# Patient Record
Sex: Male | Born: 1970 | Race: White | Hispanic: No | Marital: Married | Smoking: Current every day smoker
Health system: Southern US, Community
[De-identification: ages and names within clinical notes are randomized; demographics above are authoritative.]

## PROBLEM LIST (undated history)

## (undated) DIAGNOSIS — M51369 Other intervertebral disc degeneration, lumbar region without mention of lumbar back pain or lower extremity pain: Secondary | ICD-10-CM

## (undated) DIAGNOSIS — M5136 Other intervertebral disc degeneration, lumbar region: Secondary | ICD-10-CM

## (undated) DIAGNOSIS — E7800 Pure hypercholesterolemia, unspecified: Secondary | ICD-10-CM

## (undated) HISTORY — DX: Pure hypercholesterolemia, unspecified: E78.00

## (undated) HISTORY — PX: WRIST SURGERY: SHX841

## (undated) HISTORY — PX: HERNIA REPAIR: SHX51

## (undated) HISTORY — DX: Other intervertebral disc degeneration, lumbar region without mention of lumbar back pain or lower extremity pain: M51.369

## (undated) HISTORY — DX: Other intervertebral disc degeneration, lumbar region: M51.36

## (undated) HISTORY — PX: TONSILLECTOMY: SUR1361

---

## 2004-03-24 ENCOUNTER — Emergency Department (HOSPITAL_COMMUNITY): Admission: EM | Admit: 2004-03-24 | Discharge: 2004-03-24 | Payer: Self-pay | Admitting: Emergency Medicine

## 2004-07-23 ENCOUNTER — Emergency Department: Payer: Self-pay | Admitting: Emergency Medicine

## 2009-02-23 ENCOUNTER — Emergency Department: Payer: Self-pay

## 2012-02-14 ENCOUNTER — Emergency Department: Payer: Self-pay | Admitting: Emergency Medicine

## 2013-03-09 ENCOUNTER — Emergency Department: Payer: Self-pay | Admitting: Emergency Medicine

## 2013-03-09 LAB — DRUG SCREEN, URINE
Amphetamines, Ur Screen: NEGATIVE (ref ?–1000)
Benzodiazepine, Ur Scrn: NEGATIVE (ref ?–200)
MDMA (Ecstasy)Ur Screen: NEGATIVE (ref ?–500)
Opiate, Ur Screen: NEGATIVE (ref ?–300)
Tricyclic, Ur Screen: NEGATIVE (ref ?–1000)

## 2013-03-09 LAB — CBC
HGB: 16.1 g/dL (ref 13.0–18.0)
MCHC: 35.3 g/dL (ref 32.0–36.0)
MCV: 92 fL (ref 80–100)
RBC: 4.98 10*6/uL (ref 4.40–5.90)
RDW: 14.9 % — ABNORMAL HIGH (ref 11.5–14.5)
WBC: 9.4 10*3/uL (ref 3.8–10.6)

## 2013-03-09 LAB — URINALYSIS, COMPLETE
Bacteria: NONE SEEN
Ketone: NEGATIVE
Leukocyte Esterase: NEGATIVE
Nitrite: NEGATIVE
Ph: 7 (ref 4.5–8.0)
RBC,UR: 1 /HPF (ref 0–5)
Squamous Epithelial: NONE SEEN
WBC UR: 1 /HPF (ref 0–5)

## 2013-03-09 LAB — BASIC METABOLIC PANEL
BUN: 12 mg/dL (ref 7–18)
Chloride: 107 mmol/L (ref 98–107)
Creatinine: 1.25 mg/dL (ref 0.60–1.30)
EGFR (African American): >60
EGFR (Non-African Amer.): >60
Potassium: 4.3 mmol/L (ref 3.5–5.1)

## 2013-03-17 DIAGNOSIS — G43909 Migraine, unspecified, not intractable, without status migrainosus: Secondary | ICD-10-CM | POA: Insufficient documentation

## 2014-05-23 ENCOUNTER — Emergency Department: Payer: Self-pay | Admitting: Emergency Medicine

## 2014-06-18 ENCOUNTER — Ambulatory Visit: Payer: Self-pay | Admitting: Surgery

## 2014-12-07 ENCOUNTER — Encounter: Payer: Self-pay | Admitting: Podiatry

## 2014-12-07 ENCOUNTER — Ambulatory Visit (INDEPENDENT_AMBULATORY_CARE_PROVIDER_SITE_OTHER): Payer: BLUE CROSS/BLUE SHIELD

## 2014-12-07 ENCOUNTER — Ambulatory Visit (INDEPENDENT_AMBULATORY_CARE_PROVIDER_SITE_OTHER): Payer: BLUE CROSS/BLUE SHIELD | Admitting: Podiatry

## 2014-12-07 VITALS — BP 131/86 | HR 83 | Resp 16 | Ht 71.0 in | Wt 220.0 lb

## 2014-12-07 DIAGNOSIS — M79673 Pain in unspecified foot: Secondary | ICD-10-CM

## 2014-12-07 DIAGNOSIS — M722 Plantar fascial fibromatosis: Secondary | ICD-10-CM | POA: Diagnosis not present

## 2014-12-07 MED ORDER — METHYLPREDNISOLONE (PAK) 4 MG PO TABS: ORAL_TABLET | ORAL | Status: AC

## 2014-12-07 MED ORDER — MELOXICAM 15 MG PO TABS: 15.0000 mg | ORAL_TABLET | Freq: Every day | ORAL | Status: AC

## 2014-12-07 NOTE — Patient Instructions (Signed)

## 2014-12-07 NOTE — Progress Notes (Signed)
   Subjective:    Patient ID: Louis Hughes, male    DOB: August 17, 1971, 44 y.o.   MRN: [id number]  HPI  Last two to three months the bottom of my feet have been killing me , changed all my shoes, tried gel inserts. I am on my feet twelve hours a day on concrete floors. As soon as my feet hit the ground in the morning it is instant hurt. Ball to heel pain .   Review of Systems  All other systems reviewed and are negative.      Objective:   Physical Exam: I have reviewed his past history medications allergies surgery social history and review of systems. pulses are strongly palpable bilateral. Neurologic sensorium is intact per Semmes-Weinstein monofilament. Deep tendon reflexes intact bilateral muscle strength +5 over 5 dorsiflexion plantar flexors and inverters everters onto the musculature is intact. Orthopedic evaluation shows all joints distal to the ankle for range of motion without crepitation. He has pain on palpation medial continued tubercles bilateral. Radiographic evaluation does demonstrate soft tissue increase in density at the plantar fascial calcaneal insertion site.        Assessment & Plan:  Assessment: Plantar fasciitis bilateral.  Plan: Discussed the etiology pathology conservative versus surgical therapies. Injected the bilateral heels today with Kenalog and local anesthetic dispense plantar fascial braces bilateral. Dispensed a single night splint. Discussed appropriate shoe gear stretching exercises ice therapy and sugar modifications. Dispensed a prescription for Medrol Dosepak to be followed by meloxicam.

## 2015-01-04 ENCOUNTER — Ambulatory Visit (INDEPENDENT_AMBULATORY_CARE_PROVIDER_SITE_OTHER): Payer: BLUE CROSS/BLUE SHIELD | Admitting: Podiatry

## 2015-01-04 ENCOUNTER — Encounter: Payer: Self-pay | Admitting: Podiatry

## 2015-01-04 VITALS — BP 141/94 | HR 97 | Resp 16

## 2015-01-04 DIAGNOSIS — M722 Plantar fascial fibromatosis: Secondary | ICD-10-CM | POA: Diagnosis not present

## 2015-01-04 NOTE — Progress Notes (Signed)
He presents today for follow-up of his plantar fasciitis he states that really they are 100% improved. I only have some generalized foot pain occasionally.  Objective: Vital signs are stable he is alert and oriented 3. Pulses are palpable bilateral. No pain on palpation medial calcaneal tubercle bilateral.  Assessment: Resolving plantar fasciitis 100% improved.  Plan: Continue all conservative therapies including medication 1 more month.

## 2015-01-09 NOTE — Op Note (Signed)
PATIENT NAME:  Louis Hughes, Louis Hughes MR#:  [id number] DATE OF BIRTH:  02-Jan-1971  DATE OF PROCEDURE:  06/18/2014  PREOPERATIVE DIAGNOSIS: Epigastric hernia.   POSTOPERATIVE DIAGNOSIS: Epigastric hernia.   OPERATION: Robotic-assisted laparoscopic epigastric hernia repair.   ANESTHESIA: General.   SURGEON: Quentin Orealph L. Ely III, MD.    OPERATIVE PROCEDURE: With the patient in the supine position after the induction of appropriate general anesthesia, the patient was appropriately padded and positioned. The patient's abdomen was prepped with ChloraPrep and draped with sterile towels. A left mid abdominal incision laterally was made transversely and the abdomen cannulated with the Optiview apparatus. The camera was inserted and the abdomen inspected. The defect was identified several centimeters above the umbilicus. There appeared to be some preperitoneal fat in the defect. The abdomen was insufflated to appropriate pressure measurements. Superior and inferolateral ports were placed under direct vision using robotic 8.5 mm ports. Assistance port using a 10 mm port was placed in the left upper quadrant. The robot was brought to the table and appropriately docked in position. Instruments were then inserted under direct vision and I moved to the console. The preperitoneal fat was removed from the defect. The defect itself was approximately 1 cm in size. The defect was closed primarily using V-Loc suture. A piece of Ventralex mesh was brought to the table, shortened on the wings, inserted through the assistant port, and brought out through the abdominal wall using the suture passer. It was sutured in place with 2-0 Ethibond. The repair appeared to be satisfactory. All instruments were withdrawn without difficulty. The robot was undocked. The abdomen was desufflated. The skin incision was closed with 5-0 nylon. The area was infiltrated with 0.25% Marcaine for postoperative pain control. Sterile dressings were applied.  The patient was returned to the recovery room, having tolerated the procedure well. Sponge and needle counts were correct x 2 in the Operating Room.    ____________________________ Carmie Endalph L. Ely III, MD rle:at D: 06/18/2014 09:14:35 ET T: 06/18/2014 11:25:58 ET JOB#: [id number]  cc: Quentin Orealph L. Ely III, MD, <Dictator> Quentin OreALPH L ELY MD ELECTRONICALLY SIGNED 06/19/2014 17:52

## 2015-07-26 ENCOUNTER — Emergency Department: Payer: Self-pay

## 2015-07-26 ENCOUNTER — Emergency Department
Admission: EM | Admit: 2015-07-26 | Discharge: 2015-07-26 | Disposition: A | Discharge status: Home / Self Care | Payer: Self-pay | Attending: Emergency Medicine | Admitting: Emergency Medicine

## 2015-07-26 ENCOUNTER — Encounter: Payer: Self-pay | Admitting: Emergency Medicine

## 2015-07-26 DIAGNOSIS — M5431 Sciatica, right side: Secondary | ICD-10-CM | POA: Insufficient documentation

## 2015-07-26 DIAGNOSIS — X501XXA Overexertion from prolonged static or awkward postures, initial encounter: Secondary | ICD-10-CM | POA: Insufficient documentation

## 2015-07-26 DIAGNOSIS — Y9289 Other specified places as the place of occurrence of the external cause: Secondary | ICD-10-CM | POA: Insufficient documentation

## 2015-07-26 DIAGNOSIS — R200 Anesthesia of skin: Secondary | ICD-10-CM | POA: Insufficient documentation

## 2015-07-26 DIAGNOSIS — Y998 Other external cause status: Secondary | ICD-10-CM | POA: Insufficient documentation

## 2015-07-26 DIAGNOSIS — Z791 Long term (current) use of non-steroidal anti-inflammatories (NSAID): Secondary | ICD-10-CM | POA: Insufficient documentation

## 2015-07-26 DIAGNOSIS — Z720 Tobacco use: Secondary | ICD-10-CM | POA: Insufficient documentation

## 2015-07-26 DIAGNOSIS — M5432 Sciatica, left side: Secondary | ICD-10-CM | POA: Insufficient documentation

## 2015-07-26 DIAGNOSIS — Z79899 Other long term (current) drug therapy: Secondary | ICD-10-CM | POA: Insufficient documentation

## 2015-07-26 DIAGNOSIS — Y93F2 Activity, caregiving, lifting: Secondary | ICD-10-CM | POA: Insufficient documentation

## 2015-07-26 MED ORDER — KETOROLAC TROMETHAMINE 10 MG PO TABS: 10.0000 mg | ORAL_TABLET | Freq: Three times a day (TID) | ORAL | Status: AC | PRN

## 2015-07-26 MED ORDER — ACETAMINOPHEN 500 MG PO TABS
1000.0000 mg | ORAL_TABLET | Freq: Once | ORAL | Status: AC
  Administered 2015-07-26: 1000 mg via ORAL
  Filled 2015-07-26: qty 2

## 2015-07-26 MED ORDER — KETOROLAC TROMETHAMINE 30 MG/ML IJ SOLN
60.0000 mg | Freq: Once | INTRAMUSCULAR | Status: AC
  Administered 2015-07-26: 60 mg via INTRAMUSCULAR
  Filled 2015-07-26: qty 2

## 2015-07-26 MED ORDER — PREDNISONE 10 MG PO TABS: ORAL_TABLET | ORAL | Status: AC

## 2015-07-26 NOTE — Discharge Instructions (Signed)
For your back pain, you may apply a heating pad for 15 minutes every 2 hours. You may take Tylenol and Toradol for for pain. Do not take ibuprofen, Aleve, Motrin, Advil or any other NSAID medications when you're taking Toradol. Please take Toradol with food.  Please return to the emergency department if you develop severe pain, inability to walk, changes in bowel or bladder function, fever or any other symptoms concerning to you.

## 2015-07-26 NOTE — ED Notes (Signed)
Pt states he was picking up his 1016 month old and felt a pop and now states lower back pain radiating to his legs, pt ambulatory to room

## 2015-07-26 NOTE — ED Notes (Signed)
Pain is across low back into hips and radiates down both legs.

## 2015-07-26 NOTE — ED Provider Notes (Signed)
[facility] Emergency Department Provider Note  ____________________________________________  Time seen: Approximately 4:32 PM  I have reviewed the triage vital signs and the nursing notes.   HISTORY  Chief Complaint Back Pain    HPI Louis Hughes is a 44 y.o. male with a history of degenerative disc disease in the lumbar spine presenting with low back pain after heavy lifting. Patient states that several days ago he was lifting his 31-month-old when he fell an acute pop and had immediate pain in the low back. Since then he has been having continued low back pain with radiation to the bilateral buttocks and posterior thighs. He has had intermittent numbness in the legs but does not have any at this time. He also reports intermittent numbness in his left upper arm which started and resolved on the way here.He denies any saddle anesthesia, urinary or fecal incontinence or retention, IV drug abuse, fever. He has tried heating pads and ibuprofen without significant improvement. Patient reports worsening pain with walking.  Past Medical History  Diagnosis Date  . High cholesterol   . DDD (degenerative disc disease), lumbar     Patient Active Problem List   Diagnosis Date Noted  . Headache, migraine 03/17/2013    Past Surgical History  Procedure Laterality Date  . Hernia repair    . Wrist surgery    . Tonsillectomy      Current Outpatient Rx  Name  Route  Sig  Dispense  Refill  . Cholecalciferol (VITAMIN D3) 3000 UNITS TABS   Oral   Take by mouth.         . Cyanocobalamin (VITAMIN B 12 PO)   Oral   Take by mouth.         . cyclobenzaprine (FLEXERIL) 10 MG tablet   Oral   Take 10 mg by mouth 3 (three) times daily as needed for muscle spasms.         Marland Kitchen ketorolac (TORADOL) 10 MG tablet   Oral   Take 1 tablet (10 mg total) by mouth every 8 (eight) hours as needed for moderate pain (with food).   15 tablet   0   . meloxicam (MOBIC) 15  MG tablet   Oral   Take 1 tablet (15 mg total) by mouth daily.   30 tablet   1   . methylPREDNIsolone (MEDROL DOSPACK) 4 MG tablet      6 day tapering dose-follow package instructions   21 tablet   0   . predniSONE (DELTASONE) 10 MG tablet       PO day 1,  PO day 2,  PO day 3,  PO day 4,  PO day 5, 20 mg PO day 6,  PO day 7   23 tablet   0     Allergies Review of patient's allergies indicates no known allergies.  No family history on file.  Social History Social History  Substance Use Topics  . Smoking status: Current Every Day Smoker  . Smokeless tobacco: None  . Alcohol Use: None    Review of Systems Constitutional: No fever/chills. No lightheadedness or syncope. Eyes: No visual changes. ENT: No sore throat. Cardiovascular: Denies chest pain, palpitations. Respiratory: Denies shortness of breath.  No cough. Gastrointestinal: No abdominal pain.  No nausea, no vomiting.  No diarrhea.  No constipation. Genitourinary: Negative for dysuria. Musculoskeletal: Positive for back pain. Skin: Negative for rash. Neurological: Negative for headaches, focal weakness. Positive intermittent numbness. No fecal or urinary incontinence or  retention. No saddle anesthesia.  10-point ROS otherwise negative.  ____________________________________________   PHYSICAL EXAM:  VITAL SIGNS: ED Triage Vitals  Enc Vitals Group     BP 07/26/15 1549 118/81 mmHg     Pulse Rate 07/26/15 1549 82     Resp 07/26/15 1549 16     Temp 07/26/15 1549 97.7 F (36.5 C)     Temp Source 07/26/15 1549 Oral     SpO2 07/26/15 1549 100 %     Weight 07/26/15 1549 208 lb (94.348 kg)     Height 07/26/15 1549  (1.803 m)     Head Cir --      Peak Flow --      Pain Score 07/26/15 1551 10     Pain Loc --      Pain Edu? --      Excl. in GC? --     Constitutional: Alert and oriented. Sitting on the edge of the bed reading a book in no acute distress. Answers questions  appropriate. Eyes: Conjunctivae are normal.  EOMI. Head: Atraumatic. Nose: No congestion/rhinnorhea. Mouth/Throat: Mucous membranes are moist.  Neck: No stridor.  Supple.  Full range of motion without pain. No midline C-spine tenderness to palpation, step-offs or deformities. Cardiovascular: Normal rate, regular rhythm. No murmurs, rubs or gallops.  Respiratory: Normal respiratory effort.  No retractions. Lungs CTAB.  No wheezes, rales or ronchi. Gastrointestinal: Soft and nontender. No distention. No peritoneal signs. Musculoskeletal: No LE edema. Tender to palpation in the midline over the lowest lumbar spine and upper sacrum.  Tender to palpation in the upper buttocks bilaterally. Neurologic:  Normal speech and language. 5 out of 5 bilateral hip flexors, hamstrings and quads, plantar flexion and dorsiflexion. Normal sensation to light touch in the bilateral upper extremities and lower extremities. Normal gait that is slow but steady and without ataxia or limp. Skin:  Skin is warm, dry and intact. No rash noted. Psychiatric: Mood and affect are normal. Speech and behavior are normal.  Normal judgement.  ____________________________________________   LABS (all labs ordered are listed, but only abnormal results are displayed)  Labs Reviewed - No data to display ____________________________________________  EKG  I indicated ____________________________________________  RADIOLOGY  Dg Lumbar Spine Complete  07/26/2015  CLINICAL DATA:  Popping sensation, lifting injury, low back pain. EXAM: LUMBAR SPINE - COMPLETE 4+ VIEW COMPARISON:  02/23/2009 FINDINGS: Minor endplate degenerative changes. Normal alignment. Preserved vertebral body heights. No acute compression fracture, wedge-shaped deformity or focal kyphosis. Facets aligned. No pars defects. Normal SI joints. Abdominal aortic atherosclerosis present. IMPRESSION: No acute osseous finding. Abdominal atherosclerosis Electronically Signed    By: Judie Petit.  Shick M.D.   On: 07/26/2015 17:06    ____________________________________________   PROCEDURES  Procedure(s) performed: None  Critical Care performed: No ____________________________________________   INITIAL IMPRESSION / ASSESSMENT AND PLAN / ED COURSE  Pertinent labs & imaging results that were available during my care of the patient were reviewed by me and considered in my medical decision making (see chart for details).  44 y.o. male with a history of chronic degenerative disc disease in the lumbar spine presenting with acute onset of low back pain after heavy lifting. He is having radicular symptoms into the bilateral buttocks and posterior thighs. On my exam he has no focal neurologic deficits. He does not have saddle anesthesia or fecal or urinary incontinence or retention. He does not have evidence for cord compression or cauda equina syndrome today. He does not have a history  that would predispose him to an epidural abscess. The most likely etiology of his pain is acute on chronic lumbar pain. We will plan to get an x-ray of the low back and initiate symptomatic treatment in the emergency department.  ----------------------------------------- 5:34 PM on 07/26/2015 -----------------------------------------  The patient's x-rays negative for any acute findings and his pain has improved. We will plan discharge with close PMD follow-up. He understands return precautions and follow-up instructions. ____________________________________________  FINAL CLINICAL IMPRESSION(S) / ED DIAGNOSES  Final diagnoses:  Bilateral sciatica  Numbness      NEW MEDICATIONS STARTED DURING THIS VISIT:  New Prescriptions   KETOROLAC (TORADOL) 10 MG TABLET    Take 1 tablet (10 mg total) by mouth every 8 (eight) hours as needed for moderate pain (with food).   PREDNISONE (DELTASONE) 10 MG TABLET    60mg  PO day 1, 50mg  PO day 2, 40mg  PO day 3, 30mg  PO day 4, 20mg  PO day 5, 20 mg PO day 6,  10mg  PO day 7     Rockne MenghiniAnne-Caroline Analie Katzman, MD 07/26/15 1734

## 2015-07-26 NOTE — ED Notes (Signed)
Saturday night while picking up 8716 month old felt a "pop" to lower back. C/O low back pain since that time... Has been using heat, rest, Ibuprofen for pain with no relief.

## 2015-07-31 ENCOUNTER — Encounter: Payer: Self-pay | Admitting: Emergency Medicine

## 2015-07-31 ENCOUNTER — Emergency Department
Admission: EM | Admit: 2015-07-31 | Discharge: 2015-07-31 | Disposition: A | Discharge status: Home / Self Care | Payer: Self-pay | Attending: Emergency Medicine | Admitting: Emergency Medicine

## 2015-07-31 DIAGNOSIS — Z720 Tobacco use: Secondary | ICD-10-CM | POA: Insufficient documentation

## 2015-07-31 DIAGNOSIS — Z7689 Persons encountering health services in other specified circumstances: Secondary | ICD-10-CM

## 2015-07-31 DIAGNOSIS — Z791 Long term (current) use of non-steroidal anti-inflammatories (NSAID): Secondary | ICD-10-CM | POA: Insufficient documentation

## 2015-07-31 DIAGNOSIS — Z0289 Encounter for other administrative examinations: Secondary | ICD-10-CM | POA: Insufficient documentation

## 2015-07-31 NOTE — ED Provider Notes (Signed)
[facility] Emergency Department Provider Note  ____________________________________________  Time seen: Approximately 10:35 AM  I have reviewed the triage vital signs and the nursing notes.   HISTORY  Chief Complaint Back Pain    HPI Louis Hughes is a 44 y.o. male patient requested a work note that is required by his job for returning back to full duties. Patient was seen on 07/26/2015 for back pain. Patient states complaint has resolved status post taken to [facility] and a muscle relaxant. Patient denies any radicular component to this complaint rates his pain as a 0.   Past Medical History  Diagnosis Date  . High cholesterol   . DDD (degenerative disc disease), lumbar     Patient Active Problem List   Diagnosis Date Noted  . Headache, migraine 03/17/2013    Past Surgical History  Procedure Laterality Date  . Hernia repair    . Wrist surgery    . Tonsillectomy      Current Outpatient Rx  Name  Route  Sig  Dispense  Refill  . Cholecalciferol (VITAMIN D3) 3000 UNITS TABS   Oral   Take by mouth.         . Cyanocobalamin (VITAMIN B 12 PO)   Oral   Take by mouth.         . cyclobenzaprine (FLEXERIL) 10 MG tablet   Oral   Take 10 mg by mouth 3 (three) times daily as needed for muscle spasms.         Marland Kitchen ketorolac (TORADOL) 10 MG tablet   Oral   Take 1 tablet (10 mg total) by mouth every 8 (eight) hours as needed for moderate pain (with food).   15 tablet   0   . meloxicam (MOBIC) 15 MG tablet   Oral   Take 1 tablet (15 mg total) by mouth daily.   30 tablet   1   . methylPREDNIsolone (MEDROL DOSPACK) 4 MG tablet      6 day tapering dose-follow package instructions   21 tablet   0   . predniSONE (DELTASONE) 10 MG tablet       PO day 1,  PO day 2,  PO day 3,  PO day 4,  PO day 5, 20 mg PO day 6,  PO day 7   23 tablet   0     Allergies Review of patient's allergies indicates no known  allergies.  No family history on file.  Social History Social History  Substance Use Topics  . Smoking status: Current Every Day Smoker  . Smokeless tobacco: None  . Alcohol Use: None    Review of Systems Constitutional: No fever/chills Eyes: No visual changes. ENT: No sore throat. Cardiovascular: Denies chest pain. Respiratory: Denies shortness of breath. Gastrointestinal: No abdominal pain.  No nausea, no vomiting.  No diarrhea.  No constipation. Genitourinary: Negative for dysuria. Musculoskeletal: Negative for back pain. Skin: Negative for rash. Neurological: Negative for headaches, focal weakness or numbness. 10-point ROS otherwise negative.  ____________________________________________   PHYSICAL EXAM:  VITAL SIGNS: ED Triage Vitals  Enc Vitals Group     BP 07/31/15 1016 131/92 mmHg     Pulse Rate 07/31/15 1016 82     Resp 07/31/15 1016 16     Temp 07/31/15 1016 98.1 F (36.7 C)     Temp Source 07/31/15 1016 Oral     SpO2 07/31/15 1016 97 %     Weight --      Height --  Head Cir --      Peak Flow --      Pain Score 07/31/15 1013 0     Pain Loc --      Pain Edu? --      Excl. in GC? --     Constitutional: Alert and oriented. Well appearing and in no acute distress. Eyes: Conjunctivae are normal. PERRL. EOMI. Head: Atraumatic. Nose: No congestion/rhinnorhea. Mouth/Throat: Mucous membranes are moist.  Oropharynx non-erythematous. Neck: No stridor. No cervical spine tenderness to palpation. Hematological/Lymphatic/Immunilogical: No cervical lymphadenopathy. Cardiovascular: Normal rate, regular rhythm. Grossly normal heart sounds.  Good peripheral circulation. Respiratory: Normal respiratory effort.  No retractions. Lungs CTAB. Gastrointestinal: Soft and nontender. No distention. No abdominal bruits. No CVA tenderness. Musculoskeletal: No lower extremity tenderness nor edema.  No joint effusions. Neurologic:  Normal speech and language. No gross focal  neurologic deficits are appreciated. No gait instability. Skin:  Skin is warm, dry and intact. No rash noted. Psychiatric: Mood and affect are normal. Speech and behavior are normal.  ____________________________________________   LABS (all labs ordered are listed, but only abnormal results are displayed)  Labs Reviewed - No data to display ____________________________________________  EKG   ____________________________________________  RADIOLOGY   ____________________________________________   PROCEDURES  Procedure(s) performed: None  Critical Care performed: No  ____________________________________________   INITIAL IMPRESSION / ASSESSMENT AND PLAN / ED COURSE  Pertinent labs & imaging results that were available during my care of the patient were reviewed by me and considered in my medical decision making (see chart for details).  Resolved low back pain. Patient [facility]. Patient can return to work. ____________________________________________   FINAL CLINICAL IMPRESSION(S) / ED DIAGNOSES  Final diagnoses:  Return to work evaluation      Joni ReiningRonald K Bryannah Boston, PA-C 07/31/15 1044  Sharyn CreamerMark Quale, MD 07/31/15 [phone]

## 2015-07-31 NOTE — ED Notes (Signed)
States last Saturday pain with leaning over, here for clearance to return to work

## 2015-07-31 NOTE — Discharge Instructions (Signed)
Return to Work °__________________________________________________ was treated at our facility. °INJURY OR ILLNESS WAS: °_____ Work related °_____ Not work related °_____ Undetermined if work related °RETURN TO WORK °· Employee may return to work on ____________________. °· Employee may return to modified work on ____________________. °WORK ACTIVITY RESTRICTIONS °Work activities that are not tolerated include: °_____ Bending °_____ Prolonged sitting °_____ Lifting more than ____________________ lb °_____ Squatting °_____ Prolonged standing °_____ Climbing °_____ Reaching °_____ Pushing and pulling °_____ Walking °_____ Other ____________________ °These restrictions are effective until ____________________. °Show this Return to Work statement to your supervisor at work as soon as possible. Your employer should be aware of your condition and can help with the necessary work activity restrictions. If you wish to return to work sooner than the date that is listed above, or if you have further problems that make it difficult for you to return at that time, please call our clinic or your health care provider. °_________________________________________ °Health Care Provider Name (printed) °_________________________________________ °Health Care Provider (signature)  °_________________________________________ °Date °  °This information is not intended to replace advice given to you by your health care provider. Make sure you discuss any questions you have with your health care provider. °  °Document Released: 09/04/2005 Document Revised: 09/25/2014 Document Reviewed: 04/03/2014 °Elsevier Interactive Patient Education ©2016 [employer] ° °

## 2016-01-04 ENCOUNTER — Emergency Department
Admission: EM | Admit: 2016-01-04 | Discharge: 2016-01-05 | Disposition: A | Discharge status: Home / Self Care | Payer: Self-pay | Attending: Emergency Medicine | Admitting: Emergency Medicine

## 2016-01-04 ENCOUNTER — Encounter: Payer: Self-pay | Admitting: Emergency Medicine

## 2016-01-04 DIAGNOSIS — F17210 Nicotine dependence, cigarettes, uncomplicated: Secondary | ICD-10-CM | POA: Insufficient documentation

## 2016-01-04 DIAGNOSIS — R197 Diarrhea, unspecified: Secondary | ICD-10-CM | POA: Insufficient documentation

## 2016-01-04 LAB — URINALYSIS COMPLETE WITH MICROSCOPIC (ARMC ONLY)
BILIRUBIN URINE: NEGATIVE
Bacteria, UA: NONE SEEN
Glucose, UA: NEGATIVE mg/dL
HGB URINE DIPSTICK: NEGATIVE
Ketones, ur: NEGATIVE mg/dL
Leukocytes, UA: NEGATIVE
Nitrite: NEGATIVE
PH: 6 (ref 5.0–8.0)
Protein, ur: NEGATIVE mg/dL
RBC / HPF: NONE SEEN RBC/hpf (ref 0–5)
SQUAMOUS EPITHELIAL / LPF: NONE SEEN
Specific Gravity, Urine: 1.017 (ref 1.005–1.030)

## 2016-01-04 LAB — COMPREHENSIVE METABOLIC PANEL
ALBUMIN: 3.9 g/dL (ref 3.5–5.0)
ALK PHOS: 63 U/L (ref 38–126)
ALT: 17 U/L (ref 17–63)
ANION GAP: 6 (ref 5–15)
AST: 18 U/L (ref 15–41)
BUN: 11 mg/dL (ref 6–20)
CALCIUM: 9 mg/dL (ref 8.9–10.3)
CO2: 23 mmol/L (ref 22–32)
Chloride: 111 mmol/L (ref 101–111)
Creatinine, Ser: 0.93 mg/dL (ref 0.61–1.24)
GFR calc non Af Amer: >60 mL/min (ref >60)
GLUCOSE: 99 mg/dL (ref 65–99)
POTASSIUM: 4 mmol/L (ref 3.5–5.1)
SODIUM: 140 mmol/L (ref 135–145)
TOTAL PROTEIN: 6.6 g/dL (ref 6.5–8.1)
Total Bilirubin: 0.3 mg/dL (ref 0.3–1.2)

## 2016-01-04 LAB — CBC
HCT: 43.8 % (ref 40.0–52.0)
Hemoglobin: 15 g/dL (ref 13.0–18.0)
MCH: 31.2 pg (ref 26.0–34.0)
MCHC: 34.2 g/dL (ref 32.0–36.0)
MCV: 91.1 fL (ref 80.0–100.0)
PLATELETS: 236 10*3/uL (ref 150–440)
RBC: 4.81 MIL/uL (ref 4.40–5.90)
RDW: 14.9 % — AB (ref 11.5–14.5)
WBC: 8.2 10*3/uL (ref 3.8–10.6)

## 2016-01-04 LAB — LIPASE, BLOOD: LIPASE: 26 U/L (ref 11–51)

## 2016-01-04 MED ORDER — LOPERAMIDE HCL 2 MG PO CAPS
2.0000 mg | ORAL_CAPSULE | Freq: Once | ORAL | Status: AC
  Administered 2016-01-05: 2 mg via ORAL
  Filled 2016-01-04: qty 1

## 2016-01-04 NOTE — ED Notes (Signed)
Patient ambulatory to triage with steady gait, without difficulty or distress noted; pt reports diarrhea x 2 days with nausea with mid abd pain

## 2016-01-05 MED ORDER — LOPERAMIDE HCL 2 MG PO TABS: 2.0000 mg | ORAL_TABLET | Freq: Four times a day (QID) | ORAL | Status: AC | PRN

## 2016-01-05 MED ORDER — ONDANSETRON 4 MG PO TBDP: 4.0000 mg | ORAL_TABLET | Freq: Three times a day (TID) | ORAL | Status: AC | PRN

## 2016-01-05 NOTE — Discharge Instructions (Signed)
Diarrhea Diarrhea is frequent loose and watery bowel movements. It can cause you to feel weak and dehydrated. Dehydration can cause you to become tired and thirsty, have a dry mouth, and have decreased urination that often is dark yellow. Diarrhea is a sign of another problem, most often an infection that will not last long. In most cases, diarrhea typically lasts 2-3 days. However, it can last longer if it is a sign of something more serious. It is important to treat your diarrhea as directed by your caregiver to lessen or prevent future episodes of diarrhea. CAUSES  Some common causes include:  Gastrointestinal infections caused by viruses, bacteria, or parasites.  Food poisoning or food allergies.  Certain medicines, such as antibiotics, chemotherapy, and laxatives.  Artificial sweeteners and fructose.  Digestive disorders. HOME CARE INSTRUCTIONS  Ensure adequate fluid intake (hydration): Have 1 cup (8 oz) of fluid for each diarrhea episode. Avoid fluids that contain simple sugars or sports drinks, fruit juices, whole milk products, and sodas. Your urine should be clear or pale yellow if you are drinking enough fluids. Hydrate with an oral rehydration solution that you can purchase at pharmacies, retail stores, and online. You can prepare an oral rehydration solution at home by mixing the following ingredients together:   - tsp table salt.   tsp baking soda.   tsp salt substitute containing potassium chloride.  1  tablespoons sugar.  1 L (34 oz) of water.  Certain foods and beverages may increase the speed at which food moves through the gastrointestinal (GI) tract. These foods and beverages should be avoided and include:  Caffeinated and alcoholic beverages.  High-fiber foods, such as raw fruits and vegetables, nuts, seeds, and whole grain breads and cereals.  Foods and beverages sweetened with sugar alcohols, such as xylitol, sorbitol, and mannitol.  Some foods may be well  tolerated and may help thicken stool including:  Starchy foods, such as rice, toast, pasta, low-sugar cereal, oatmeal, grits, baked potatoes, crackers, and bagels.  Bananas.  Applesauce.  Add probiotic-rich foods to help increase healthy bacteria in the GI tract, such as yogurt and fermented milk products.  Wash your hands well after each diarrhea episode.  Only take over-the-counter or prescription medicines as directed by your caregiver.  Take a warm bath to relieve any burning or pain from frequent diarrhea episodes. SEEK IMMEDIATE MEDICAL CARE IF:   You are unable to keep fluids down.  You have persistent vomiting.  You have blood in your stool, or your stools are black and tarry.  You do not urinate in 6-8 hours, or there is only a small amount of very dark urine.  You have abdominal pain that increases or localizes.  You have weakness, dizziness, confusion, or light-headedness.  You have a severe headache.  Your diarrhea gets worse or does not get better.  You have a fever or persistent symptoms for more than 2-3 days.  You have a fever and your symptoms suddenly get worse. MAKE SURE YOU:   Understand these instructions.  Will watch your condition.  Will get help right away if you are not doing well or get worse.   This information is not intended to replace advice given to you by your health care provider. Make sure you discuss any questions you have with your health care provider.   Document Released: 08/25/2002 Document Revised: 09/25/2014 Document Reviewed: 05/12/2012 Elsevier Interactive Patient Education 2016 Elsevier Inc[employer]   Food Choices to Help Relieve Diarrhea, Adult When you have   diarrhea, the foods you eat and your eating habits are very important. Choosing the right foods and drinks can help relieve diarrhea. Also, because diarrhea can last up to 7 days, you need to replace lost fluids and electrolytes (such as sodium, potassium, and chloride) in  order to help prevent dehydration.  WHAT GENERAL GUIDELINES DO I NEED TO FOLLOW?  Slowly drink 1 cup (8 oz) of fluid for each episode of diarrhea. If you are getting enough fluid, your urine will be clear or pale yellow.  Eat starchy foods. Some good choices include white rice, white toast, pasta, low-fiber cereal, baked potatoes (without the skin), saltine crackers, and bagels.  Avoid large servings of any cooked vegetables.  Limit fruit to two servings per day. A serving is  cup or 1 small piece.  Choose foods with less than 2 g of fiber per serving.  Limit fats to less than 8 tsp (38 g) per day.  Avoid fried foods.  Eat foods that have probiotics in them. Probiotics can be found in certain dairy products.  Avoid foods and beverages that may increase the speed at which food moves through the stomach and intestines (gastrointestinal tract). Things to avoid include:  High-fiber foods, such as dried fruit, raw fruits and vegetables, nuts, seeds, and whole grain foods.  Spicy foods and high-fat foods.  Foods and beverages sweetened with high-fructose corn syrup, honey, or sugar alcohols such as xylitol, sorbitol, and mannitol. WHAT FOODS ARE RECOMMENDED? Grains White rice. White, French, or pita breads (fresh or toasted), including plain rolls, buns, or bagels. White pasta. Saltine, soda, or graham crackers. Pretzels. Low-fiber cereal. Cooked cereals made with water (such as cornmeal, farina, or cream cereals). Plain muffins. Matzo. Melba toast. Zwieback.  Vegetables Potatoes (without the skin). Strained tomato and vegetable juices. Most well-cooked and canned vegetables without seeds. Tender lettuce. Fruits Cooked or canned applesauce, apricots, cherries, fruit cocktail, grapefruit, peaches, pears, or plums. Fresh bananas, apples without skin, cherries, grapes, cantaloupe, grapefruit, peaches, oranges, or plums.  Meat and Other Protein Products Baked or boiled chicken. Eggs. Tofu.  Fish. Seafood. Smooth peanut butter. Ground or well-cooked tender beef, ham, veal, lamb, pork, or poultry.  Dairy Plain yogurt, kefir, and unsweetened liquid yogurt. Lactose-free milk, buttermilk, or soy milk. Plain hard cheese. Beverages Sport drinks. Clear broths. Diluted fruit juices (except prune). Regular, caffeine-free sodas such as ginger ale. Water. Decaffeinated teas. Oral rehydration solutions. Sugar-free beverages not sweetened with sugar alcohols. Other Bouillon, broth, or soups made from recommended foods.  The items listed above may not be a complete list of recommended foods or beverages. Contact your dietitian for more options. WHAT FOODS ARE NOT RECOMMENDED? Grains Whole grain, whole wheat, bran, or rye breads, rolls, pastas, crackers, and cereals. Wild or brown rice. Cereals that contain more than 2 g of fiber per serving. Corn tortillas or taco shells. Cooked or dry oatmeal. Granola. Popcorn. Vegetables Raw vegetables. Cabbage, broccoli, Brussels sprouts, artichokes, baked beans, beet greens, corn, kale, legumes, peas, sweet potatoes, and yams. Potato skins. Cooked spinach and cabbage. Fruits Dried fruit, including raisins and dates. Raw fruits. Stewed or dried prunes. Fresh apples with skin, apricots, mangoes, pears, raspberries, and strawberries.  Meat and Other Protein Products Chunky peanut butter. Nuts and seeds. Beans and lentils. Bacon.  Dairy High-fat cheeses. Milk, chocolate milk, and beverages made with milk, such as milk shakes. Cream. Ice cream. Sweets and Desserts Sweet rolls, doughnuts, and sweet breads. Pancakes and waffles. Fats and Oils Butter. Cream sauces.   Margarine. Salad oils. Plain salad dressings. Olives. Avocados.  Beverages Caffeinated beverages (such as coffee, tea, soda, or energy drinks). Alcoholic beverages. Fruit juices with pulp. Prune juice. Soft drinks sweetened with high-fructose corn syrup or sugar alcohols. Other Coconut. Hot sauce.  Chili powder. Mayonnaise. Gravy. Cream-based or milk-based soups.  The items listed above may not be a complete list of foods and beverages to avoid. Contact your dietitian for more information. WHAT SHOULD I DO IF I BECOME DEHYDRATED? Diarrhea can sometimes lead to dehydration. Signs of dehydration include dark urine and dry mouth and skin. If you think you are dehydrated, you should rehydrate with an oral rehydration solution. These solutions can be purchased at pharmacies, retail stores, or online.  Drink -1 cup (120-240 mL) of oral rehydration solution each time you have an episode of diarrhea. If drinking this amount makes your diarrhea worse, try drinking smaller amounts more often. For example, drink 1-3 tsp (5-15 mL) every 5-10 minutes.  A general rule for staying hydrated is to drink 1-2 L of fluid per day. Talk to your health care provider about the specific amount you should be drinking each day. Drink enough fluids to keep your urine clear or pale yellow.   This information is not intended to replace advice given to you by your health care provider. Make sure you discuss any questions you have with your health care provider.   Document Released: 11/25/2003 Document Revised: 09/25/2014 Document Reviewed: 07/28/2013 Elsevier Interactive Patient Education 2016 Elsevier Inc.   

## 2016-01-05 NOTE — ED Provider Notes (Signed)
[facility] Emergency Department Provider Note  ____________________________________________  Time seen: Approximately 2341 PM  I have reviewed the triage vital signs and the nursing notes.   HISTORY  Chief Complaint Diarrhea    HPI Louis Hughes is a 45 y.o. male who comes to the ED today with some diarrhea. The patient reports that he's had some loose stool and diarrhea for the past 2-3 days. He reports that it seems so bad that he is unable to go to work. He did try to go to work yesterday and reports he had an accident on himself.The patient reports that he's been having diarrhea 4-5 times a day. He has tried taking Pepto-Bismol, Rolaids and Tums but nothing has helped. The patient reports that he's had no vomiting. The stool looks muddy liquid or stringy. He's had some occasional mid abdominal pain with the diarrhea but currently rates his pain a 1 out of 10 in intensity. He's had no fevers and no sick contacts that he is aware of. He denies dizziness or lightheadedness he's been eating and drinking well. The patient is here for evaluation.   Past Medical History  Diagnosis Date  . High cholesterol   . DDD (degenerative disc disease), lumbar     Patient Active Problem List   Diagnosis Date Noted  . Headache, migraine 03/17/2013    Past Surgical History  Procedure Laterality Date  . Hernia repair    . Wrist surgery    . Tonsillectomy      Current Outpatient Rx  Name  Route  Sig  Dispense  Refill  . Cholecalciferol (VITAMIN D3) 3000 UNITS TABS   Oral   Take by mouth.         . Cyanocobalamin (VITAMIN B 12 PO)   Oral   Take by mouth.         . cyclobenzaprine (FLEXERIL) 10 MG tablet   Oral   Take 10 mg by mouth 3 (three) times daily as needed for muscle spasms.         Marland Kitchen. ketorolac (TORADOL) 10 MG tablet   Oral   Take 1 tablet (10 mg total) by mouth every 8 (eight) hours as needed for moderate pain (with food).   15 tablet  0   . loperamide (IMODIUM A-D) 2 MG tablet   Oral   Take 1 tablet (2 mg total) by mouth 4 (four) times daily as needed for diarrhea or loose stools.   15 tablet   0   . meloxicam (MOBIC) 15 MG tablet   Oral   Take 1 tablet (15 mg total) by mouth daily.   30 tablet   1   . methylPREDNIsolone (MEDROL DOSPACK) 4 MG tablet      6 day tapering dose-follow package instructions   21 tablet   0   . ondansetron (ZOFRAN ODT) 4 MG disintegrating tablet   Oral   Take 1 tablet (4 mg total) by mouth every 8 (eight) hours as needed for nausea or vomiting.   20 tablet   0   . predniSONE (DELTASONE) 10 MG tablet      60mg  PO day 1, 50mg  PO day 2, 40mg  PO day 3, 30mg  PO day 4, 20mg  PO day 5, 20 mg PO day 6, 10mg  PO day 7   23 tablet   0     Allergies Review of patient's allergies indicates no known allergies.  No family history on file.  Social History Social History  Substance  Use Topics  . Smoking status: Current Every Day Smoker -- 1.00 packs/day    Types: Cigarettes  . Smokeless tobacco: None  . Alcohol Use: No    Review of Systems Constitutional: No fever/chills Eyes: No visual changes. ENT: No sore throat. Cardiovascular: Denies chest pain. Respiratory: Denies shortness of breath. Gastrointestinal:  abdominal pain and diarrhea.  No constipation. Genitourinary: Negative for dysuria. Musculoskeletal: Negative for back pain. Skin: Negative for rash. Neurological: Negative for headaches, focal weakness or numbness.  10-point ROS otherwise negative.  ____________________________________________   PHYSICAL EXAM:  VITAL SIGNS: ED Triage Vitals  Enc Vitals Group     BP 01/04/16 2058 125/90 mmHg     Pulse Rate 01/04/16 2058 79     Resp 01/04/16 2058 20     Temp 01/04/16 2058 97.9 F (36.6 C)     Temp Source 01/04/16 2058 Oral     SpO2 01/04/16 2058 98 %     Weight 01/04/16 2058 200 lb (90.719 kg)     Height 01/04/16 2058  (1.753 m)     Head Cir --       Peak Flow --      Pain Score 01/04/16 2058 3     Pain Loc --      Pain Edu? --      Excl. in GC? --     Constitutional: Alert and oriented. Well appearing and in no acute distress. Eyes: Conjunctivae are normal. PERRL. EOMI. Head: Atraumatic. Nose: No congestion/rhinnorhea. Mouth/Throat: Mucous membranes are moist.  Oropharynx non-erythematous. Cardiovascular: Normal rate, regular rhythm. Grossly normal heart sounds.  Good peripheral circulation. Respiratory: Normal respiratory effort.  No retractions. Lungs CTAB. Gastrointestinal: Soft and nontender. No distention. Positive bowel sounds Musculoskeletal: No lower extremity tenderness nor edema.   Neurologic:  Normal speech and language.  Skin:  Skin is warm, dry and intact.  Psychiatric: Mood and affect are normal.   ____________________________________________   LABS (all labs ordered are listed, but only abnormal results are displayed)  Labs Reviewed  CBC - Abnormal; Notable for the following:    RDW 14.9 (*)    All other components within normal limits  URINALYSIS COMPLETEWITH MICROSCOPIC ([facility] ONLY) - Abnormal; Notable for the following:    Color, Urine YELLOW (*)    APPearance CLEAR (*)    All other components within normal limits  LIPASE, BLOOD  COMPREHENSIVE METABOLIC PANEL   ____________________________________________  EKG  None ____________________________________________  RADIOLOGY  None ____________________________________________   PROCEDURES  Procedure(s) performed: None  Critical Care performed: No  ____________________________________________   INITIAL IMPRESSION / ASSESSMENT AND PLAN / ED COURSE  Pertinent labs & imaging results that were available during my care of the patient were reviewed by me and considered in my medical decision making (see chart for details).  This is a 45 year old male who comes into the hospital today with diarrhea. The patient has been having diarrhea for a  couple of days with some mild abdominal cramping but currently is having no pain. The patient's blood work is unremarkable and he is in no acute distress. He is laying on the stretcher without any discomfort or difficulty. The patient will be discharged home with some Imodium and follow-up with the acute care clinic. He has no further complaints or concerns. I will give him a note for work. ____________________________________________   FINAL CLINICAL IMPRESSION(S) / ED DIAGNOSES  Final diagnoses:  Diarrhea, unspecified type      Rebecka Apley, MD 01/05/16 [phone]

## 2016-02-03 ENCOUNTER — Emergency Department
Admission: EM | Admit: 2016-02-03 | Discharge: 2016-02-03 | Disposition: A | Discharge status: Home / Self Care | Payer: Self-pay | Attending: Emergency Medicine | Admitting: Emergency Medicine

## 2016-02-03 ENCOUNTER — Emergency Department: Payer: Self-pay

## 2016-02-03 DIAGNOSIS — R519 Headache, unspecified: Secondary | ICD-10-CM

## 2016-02-03 DIAGNOSIS — Z7952 Long term (current) use of systemic steroids: Secondary | ICD-10-CM | POA: Insufficient documentation

## 2016-02-03 DIAGNOSIS — F17210 Nicotine dependence, cigarettes, uncomplicated: Secondary | ICD-10-CM | POA: Insufficient documentation

## 2016-02-03 DIAGNOSIS — Z791 Long term (current) use of non-steroidal anti-inflammatories (NSAID): Secondary | ICD-10-CM | POA: Insufficient documentation

## 2016-02-03 DIAGNOSIS — H538 Other visual disturbances: Secondary | ICD-10-CM | POA: Insufficient documentation

## 2016-02-03 DIAGNOSIS — R51 Headache: Secondary | ICD-10-CM | POA: Insufficient documentation

## 2016-02-03 DIAGNOSIS — Z79899 Other long term (current) drug therapy: Secondary | ICD-10-CM | POA: Insufficient documentation

## 2016-02-03 DIAGNOSIS — M5136 Other intervertebral disc degeneration, lumbar region: Secondary | ICD-10-CM | POA: Insufficient documentation

## 2016-02-03 LAB — GLUCOSE, CAPILLARY: Glucose-Capillary: 120 mg/dL — ABNORMAL HIGH (ref 65–99)

## 2016-02-03 MED ORDER — KETOROLAC TROMETHAMINE 10 MG PO TABS
10.0000 mg | ORAL_TABLET | Freq: Once | ORAL | Status: AC
  Administered 2016-02-03: 10 mg via ORAL
  Filled 2016-02-03: qty 1

## 2016-02-03 MED ORDER — KETOROLAC TROMETHAMINE 10 MG PO TABS: 10.0000 mg | ORAL_TABLET | Freq: Three times a day (TID) | ORAL | Status: AC | PRN

## 2016-02-03 NOTE — ED Notes (Signed)
Pt also reports thirsty and diarrhea

## 2016-02-03 NOTE — ED Provider Notes (Signed)
[facility] Emergency Department Provider Note  ____________________________________________  Time seen: 2:55 AM  I have reviewed the triage vital signs and the nursing notes.   HISTORY  Chief Complaint Blurred Vision      HPI Louis Hughes is a 45 y.o. male presents with 3 year history of intermittent blurred vision however patient states that his vision became blurred tonight and was accompanied by a generalized headache. Patient states that he was informed May 3 years ago that he had a possible TIA. Patient states current pain score is 7 out of 10. Patient denies any weakness numbness gait instability no vomiting or nausea.    Past Medical History  Diagnosis Date  . High cholesterol   . DDD (degenerative disc disease), lumbar     Patient Active Problem List   Diagnosis Date Noted  . Headache, migraine 03/17/2013    Past Surgical History  Procedure Laterality Date  . Hernia repair    . Wrist surgery    . Tonsillectomy      Current Outpatient Rx  Name  Route  Sig  Dispense  Refill  . Cholecalciferol (VITAMIN D3) 3000 UNITS TABS   Oral   Take by mouth.         . Cyanocobalamin (VITAMIN B 12 PO)   Oral   Take by mouth.         . cyclobenzaprine (FLEXERIL) 10 MG tablet   Oral   Take 10 mg by mouth 3 (three) times daily as needed for muscle spasms.         Marland Kitchen ketorolac (TORADOL) 10 MG tablet   Oral   Take 1 tablet (10 mg total) by mouth every 8 (eight) hours as needed for moderate pain (with food).   15 tablet   0   . loperamide (IMODIUM A-D) 2 MG tablet   Oral   Take 1 tablet (2 mg total) by mouth 4 (four) times daily as needed for diarrhea or loose stools.   15 tablet   0   . meloxicam (MOBIC) 15 MG tablet   Oral   Take 1 tablet (15 mg total) by mouth daily.   30 tablet   1   . methylPREDNIsolone (MEDROL DOSPACK) 4 MG tablet      6 day tapering dose-follow package instructions   21 tablet   0   .  ondansetron (ZOFRAN ODT) 4 MG disintegrating tablet   Oral   Take 1 tablet (4 mg total) by mouth every 8 (eight) hours as needed for nausea or vomiting.   20 tablet   0   . predniSONE (DELTASONE) 10 MG tablet       PO day 1,  PO day 2,  PO day 3,  PO day 4,  PO day 5, 20 mg PO day 6,  PO day 7   23 tablet   0     Allergies No known drug allergies No family history on file.  Social History Social History  Substance Use Topics  . Smoking status: Current Every Day Smoker -- 1.00 packs/day    Types: Cigarettes  . Smokeless tobacco: Not on file  . Alcohol Use: No    Review of Systems  Constitutional: Negative for fever. Eyes: Negative for visual changes. ENT: Negative for sore throat. Cardiovascular: Negative for chest pain. Respiratory: Negative for shortness of breath. Gastrointestinal: Negative for abdominal pain, vomiting and diarrhea. Genitourinary: Negative for dysuria. Musculoskeletal: Negative for back pain. Skin: Negative for rash. Neurological:  Positive for headache and blurred vision   10-point ROS otherwise negative.  ____________________________________________   PHYSICAL EXAM:  VITAL SIGNS: ED Triage Vitals  Enc Vitals Group     BP 02/03/16 0216 115/81 mmHg     Pulse Rate 02/03/16 0216 79     Resp 02/03/16 0216 18     Temp 02/03/16 0216 97.7 F (36.5 C)     Temp Source 02/03/16 0216 Oral     SpO2 02/03/16 0216 97 %     Weight 02/03/16 0216 200 lb (90.719 kg)     Height 02/03/16 0216 5\' 9"  (1.753 m)     Head Cir --      Peak Flow --      Pain Score 02/03/16 0216 10     Pain Loc --      Pain Edu? --      Excl. in GC? --      Constitutional: Alert and oriented. Well appearing and in no distress. Eyes: Conjunctivae are normal. PERRL. Normal extraocular movements. ENT   Head: Normocephalic and atraumatic.   Nose: No congestion/rhinnorhea.   Mouth/Throat: Mucous membranes are moist.   Neck: No  stridor. Hematological/Lymphatic/Immunilogical: No cervical lymphadenopathy. Cardiovascular: Normal rate, regular rhythm. Normal and symmetric distal pulses are present in all extremities. No murmurs, rubs, or gallops. Respiratory: Normal respiratory effort without tachypnea nor retractions. Breath sounds are clear and equal bilaterally. No wheezes/rales/rhonchi. Gastrointestinal: Soft and nontender. No distention. There is no CVA tenderness. Genitourinary: deferred Musculoskeletal: Nontender with normal range of motion in all extremities. No joint effusions.  No lower extremity tenderness nor edema. Neurologic:  Normal speech and language. No gross focal neurologic deficits are appreciated. Speech is normal.  Skin:  Skin is warm, dry and intact. No rash noted. Psychiatric: Mood and affect are normal. Speech and behavior are normal. Patient exhibits appropriate insight and judgment.  ____________________________________________    LABS (pertinent positives/negatives)  Labs Reviewed  GLUCOSE, CAPILLARY - Abnormal; Notable for the following:    Glucose-Capillary 120 (*)    All other components within normal limits       RADIOLOGY CT Head Wo Contrast (Final result) Result time: 02/03/16 03:45:45   Final result by Rad Results In Interface (02/03/16 03:45:45)   Narrative:   CLINICAL DATA: 45 year old male with headache and blurred vision  EXAM: CT HEAD WITHOUT CONTRAST  TECHNIQUE: Contiguous axial images were obtained from the base of the skull through the vertex without intravenous contrast.  COMPARISON: Head CT dated 03/09/2013  FINDINGS: The ventricles and the sulci are appropriate in size for the patient's age. There is no intracranial hemorrhage. No midline shift or mass effect identified. The gray-white matter differentiation is preserved.  There is mild mucoperiosteal thickening of the paranasal sinuses. No air-fluid levels. The mastoid air cells are clear. The  calvarium is intact.  IMPRESSION: No acute intracranial pathology.   Electronically Signed By: Elgie Collard M.D. On: 02/03/2016 03:45              INITIAL IMPRESSION / ASSESSMENT AND PLAN / ED COURSE  Pertinent labs & imaging results that were available during my care of the patient were reviewed by me and considered in my medical decision making (see chart for details).  No clear etiology for the patient's headache and blurred vision identified CT scan of the head revealed no intracranial pathology. Patient will be referred for further outpatient evaluation and management.  ____________________________________________   FINAL CLINICAL IMPRESSION(S) / ED DIAGNOSES  Final diagnoses:  Blurred vision  Acute nonintractable headache, unspecified headache type      Darci Currentandolph N Jerrard Bradburn, MD 02/03/16 [id number]

## 2016-02-03 NOTE — ED Notes (Signed)
MD at bedside. 

## 2016-02-03 NOTE — ED Notes (Signed)
Pt in with acute onset of blurry vision tonight since 2230 also co headache.  States has hx of the same and was dx with TIA.

## 2016-02-03 NOTE — ED Notes (Signed)
Verbal reports received from TiptonShannon, Charity fundraiserN.

## 2016-02-03 NOTE — Discharge Instructions (Signed)

## 2016-02-03 NOTE — ED Notes (Signed)

## 2017-10-20 IMAGING — CT CT HEAD W/O CM
1 series · 16 of 30 positions shown, 20 images · non-contrast
Comparison: Head CT dated 03/09/2013

CLINICAL DATA: 45-year-old male with headache and blurred vision

EXAM:
CT HEAD WITHOUT CONTRAST
TECHNIQUE: Contiguous axial images were obtained from the base of the skull
through the vertex without intravenous contrast.

[Series 2: head wo · axial · 0.42mm/px · z∈[-141,+14]mm · 16 of 36 slices shown, 20 images]
[im 2/36  brain]
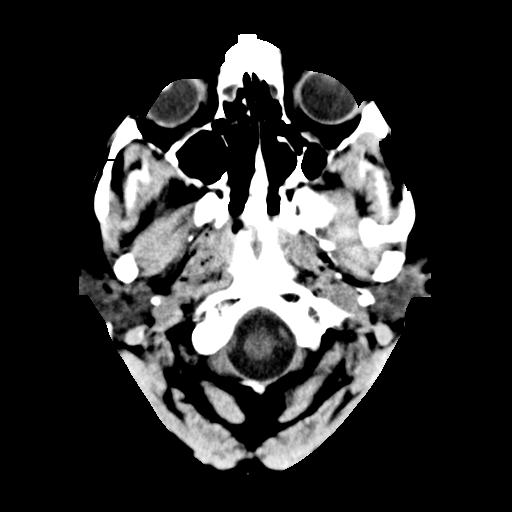
[im 2/36  bone]
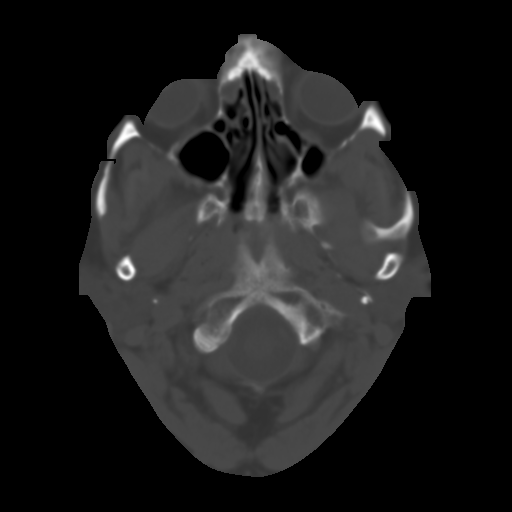
[im 4/36  brain]
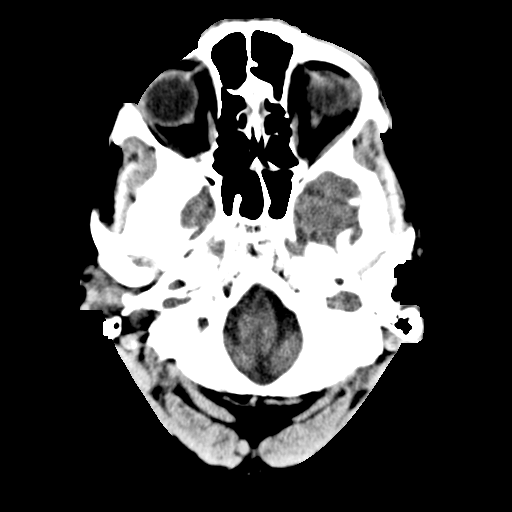
[im 7/36  brain]
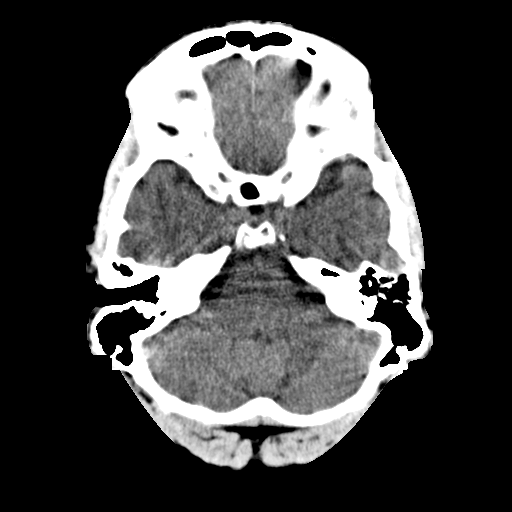
[im 9/36  brain]
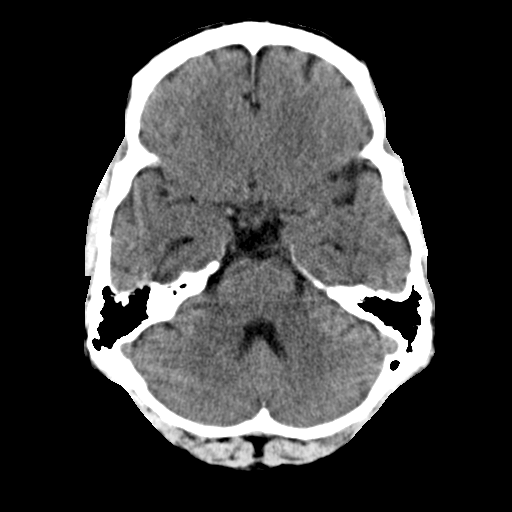
[im 10/36  brain]
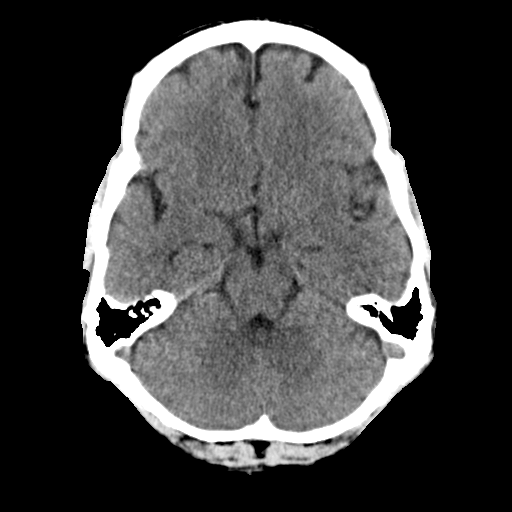
[im 10/36  bone]
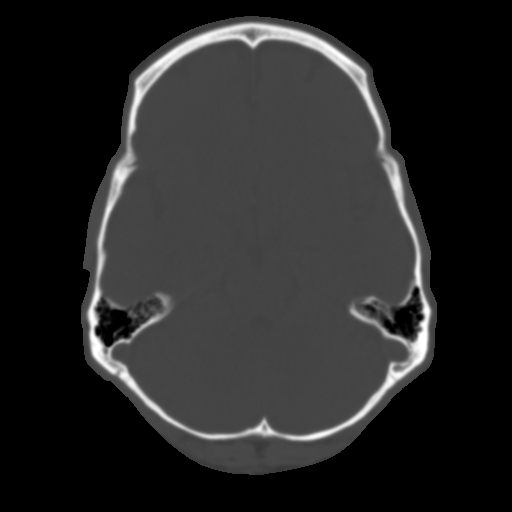
[im 13/36  brain]
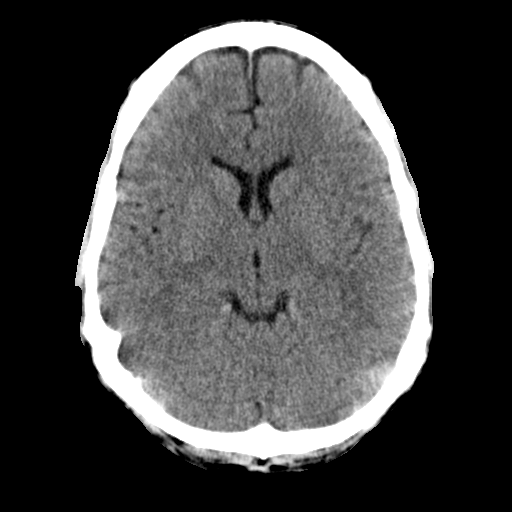
[im 15/36  brain]
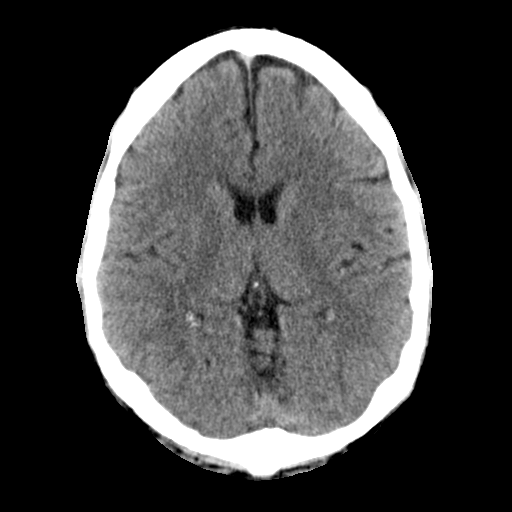
[im 17/36  brain]
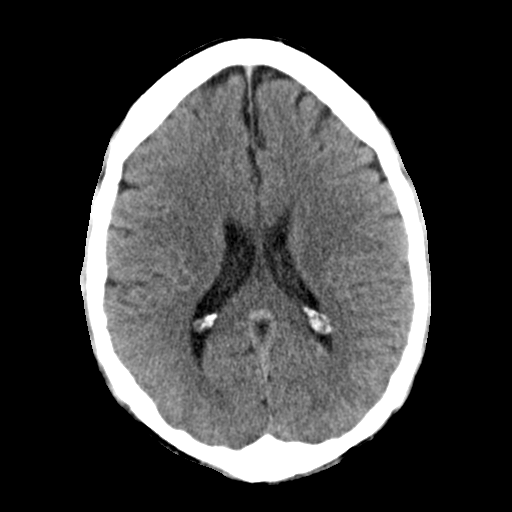
[im 19/36  brain]
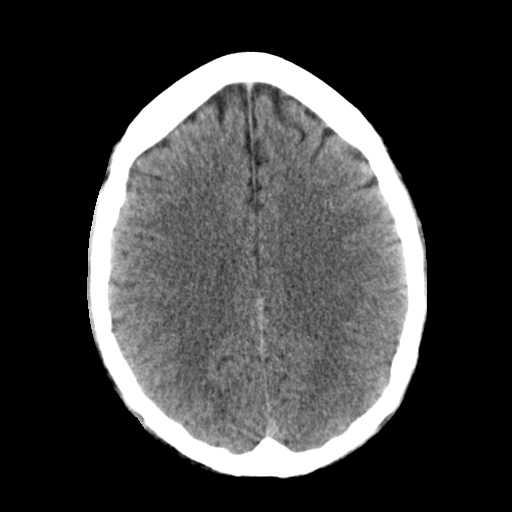
[im 19/36  bone]
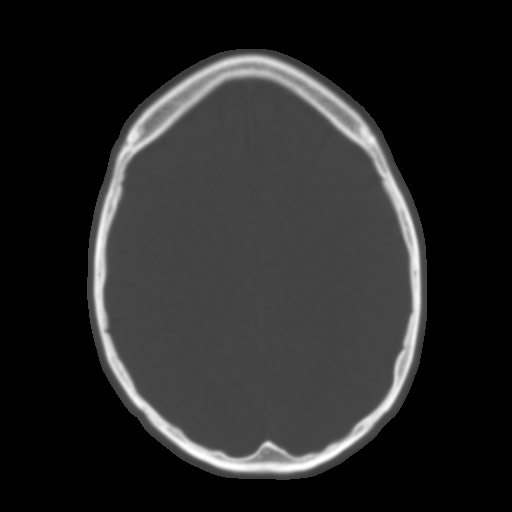
[im 21/36  brain]
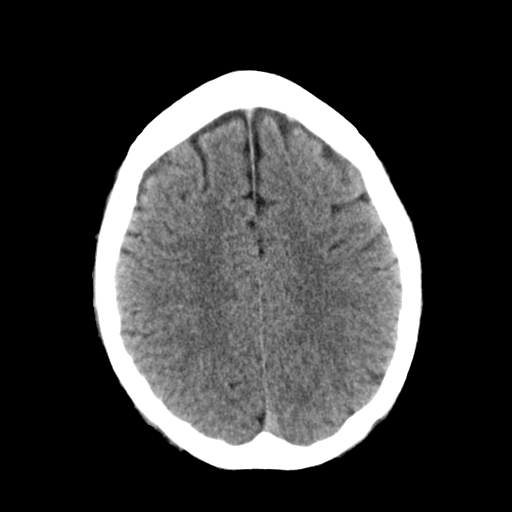
[im 23/36  brain]
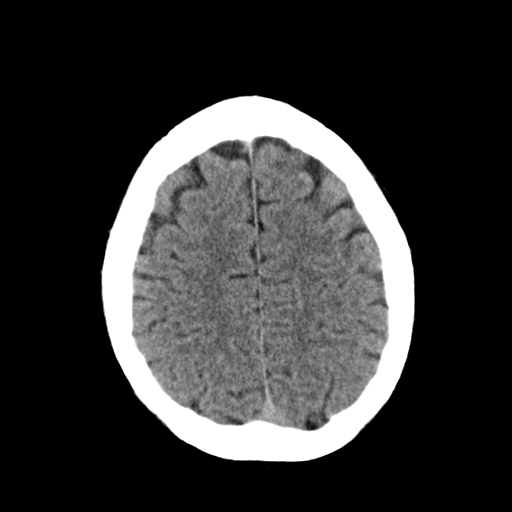
[im 26/36  brain]
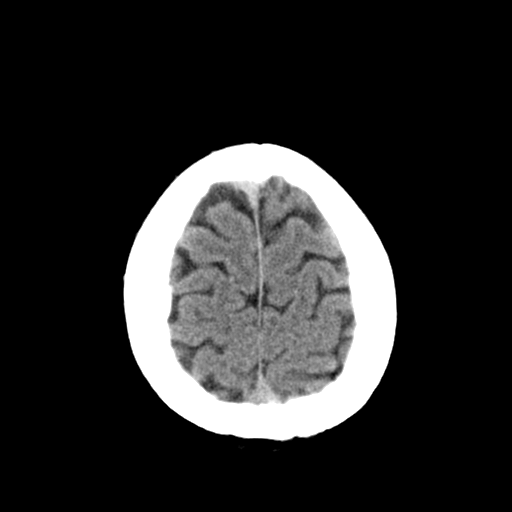
[im 27/36  brain]
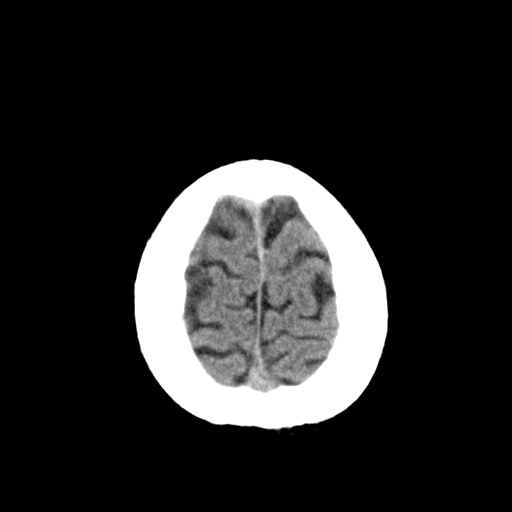
[im 27/36  bone]
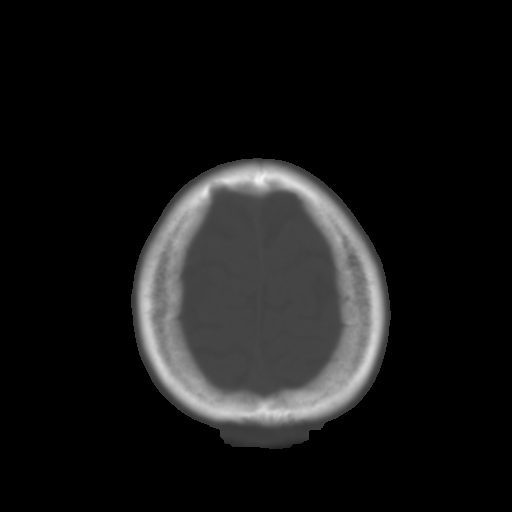
[im 29/36  brain]
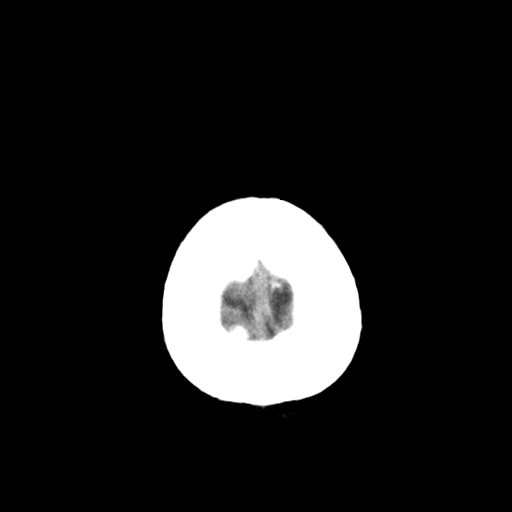
[im 32/36  brain]
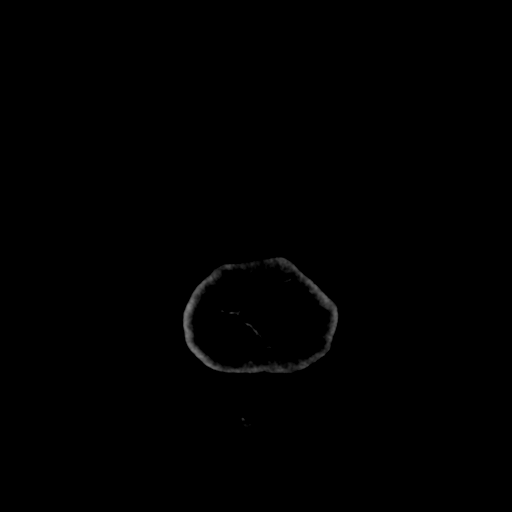
[im 34/36  brain]
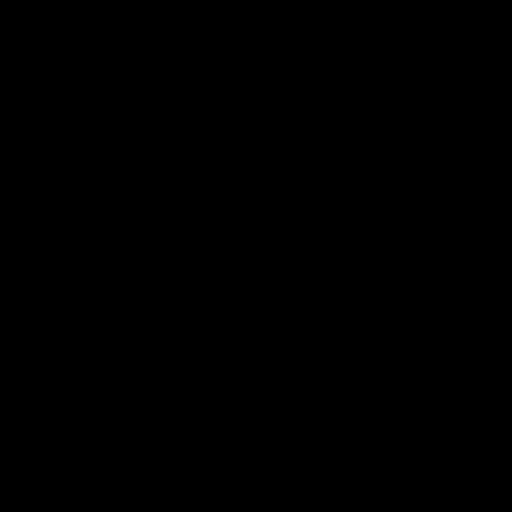

[16 of 30 positions shown; findings below may reference images not displayed]

FINDINGS: The ventricles and the sulci are appropriate in size for the
patient's age. There is no intracranial hemorrhage. No midline shift
or mass effect identified. The gray-white matter differentiation is
preserved.

There is mild mucoperiosteal thickening of the paranasal sinuses. No
air-fluid levels. The mastoid air cells are clear. The calvarium is
intact.
IMPRESSION: No acute intracranial pathology.
